# Patient Record
Sex: Male | Born: 1975 | Race: White | Hispanic: No | Marital: Married | Smoking: Current every day smoker
Health system: Southern US, Community
[De-identification: ages and names within clinical notes are randomized; demographics above are authoritative.]

## PROBLEM LIST (undated history)

## (undated) DIAGNOSIS — F329 Major depressive disorder, single episode, unspecified: Secondary | ICD-10-CM

## (undated) DIAGNOSIS — I10 Essential (primary) hypertension: Secondary | ICD-10-CM

## (undated) DIAGNOSIS — F32A Depression, unspecified: Secondary | ICD-10-CM

## (undated) HISTORY — PX: HERNIA REPAIR: SHX51

---

## 2009-08-28 ENCOUNTER — Ambulatory Visit: Payer: Self-pay | Admitting: Internal Medicine

## 2012-04-10 ENCOUNTER — Ambulatory Visit: Payer: Self-pay | Admitting: Emergency Medicine

## 2012-04-10 LAB — RAPID STREP-A WITH REFLX: Micro Text Report: NEGATIVE

## 2012-04-10 LAB — RAPID INFLUENZA A&B ANTIGENS

## 2012-04-12 LAB — BETA STREP CULTURE(ARMC)

## 2013-03-18 ENCOUNTER — Ambulatory Visit: Payer: Self-pay | Admitting: Otolaryngology

## 2013-08-04 ENCOUNTER — Emergency Department: Payer: Self-pay | Admitting: Emergency Medicine

## 2013-11-28 ENCOUNTER — Ambulatory Visit: Payer: Self-pay | Admitting: Physician Assistant

## 2013-11-28 LAB — CBC WITH DIFFERENTIAL/PLATELET
BASOS ABS: 0 10*3/uL (ref 0.0–0.1)
Basophil %: 0.6 %
Eosinophil #: 0.1 10*3/uL (ref 0.0–0.7)
Eosinophil %: 1.8 %
HCT: 43.9 % (ref 40.0–52.0)
HGB: 14.7 g/dL (ref 13.0–18.0)
Lymphocyte #: 1.3 10*3/uL (ref 1.0–3.6)
Lymphocyte %: 16.7 %
MCH: 30.3 pg (ref 26.0–34.0)
MCHC: 33.4 g/dL (ref 32.0–36.0)
MCV: 91 fL (ref 80–100)
Monocyte #: 0.7 x10 3/mm (ref 0.2–1.0)
Monocyte %: 9 %
Neutrophil #: 5.4 10*3/uL (ref 1.4–6.5)
Neutrophil %: 71.9 %
PLATELETS: 179 10*3/uL (ref 150–440)
RBC: 4.84 10*6/uL (ref 4.40–5.90)
RDW: 13.9 % (ref 11.5–14.5)
WBC: 7.5 10*3/uL (ref 3.8–10.6)

## 2013-11-28 LAB — URINALYSIS, COMPLETE
BACTERIA: NEGATIVE
Blood: NEGATIVE
GLUCOSE, UR: NEGATIVE
Ketone: NEGATIVE
Leukocyte Esterase: NEGATIVE
NITRITE: NEGATIVE
PH: 6.5 (ref 5.0–8.0)
SQUAMOUS EPITHELIAL: NONE SEEN
Specific Gravity: 1.02 (ref 1.000–1.030)

## 2013-11-28 LAB — COMPREHENSIVE METABOLIC PANEL
ALK PHOS: 115 U/L
Albumin: 4 g/dL (ref 3.4–5.0)
Anion Gap: 10 (ref 7–16)
BUN: 13 mg/dL (ref 7–18)
Bilirubin,Total: 1.2 mg/dL — ABNORMAL HIGH (ref 0.2–1.0)
CO2: 29 mmol/L (ref 21–32)
Calcium, Total: 9.4 mg/dL (ref 8.5–10.1)
Chloride: 98 mmol/L (ref 98–107)
Creatinine: 1.06 mg/dL (ref 0.60–1.30)
EGFR (Non-African Amer.): >60
GLUCOSE: 108 mg/dL — AB (ref 65–99)
Osmolality: 274 (ref 275–301)
POTASSIUM: 4.1 mmol/L (ref 3.5–5.1)
SGOT(AST): 42 U/L — ABNORMAL HIGH (ref 15–37)
SGPT (ALT): 61 U/L
SODIUM: 137 mmol/L (ref 136–145)
TOTAL PROTEIN: 8 g/dL (ref 6.4–8.2)

## 2013-11-28 LAB — URIC ACID: Uric Acid: 8.5 mg/dL — ABNORMAL HIGH (ref 3.5–7.2)

## 2015-04-30 IMAGING — CR RIGHT FOOT COMPLETE - 3+ VIEW
3 series · 3 of 3 positions shown · non-contrast
Comparison: None.

CLINICAL DATA: Injured foot last week with mid foot pain and slight
swelling

EXAM:
RIGHT FOOT COMPLETE - 3+ VIEW

[foot ap]
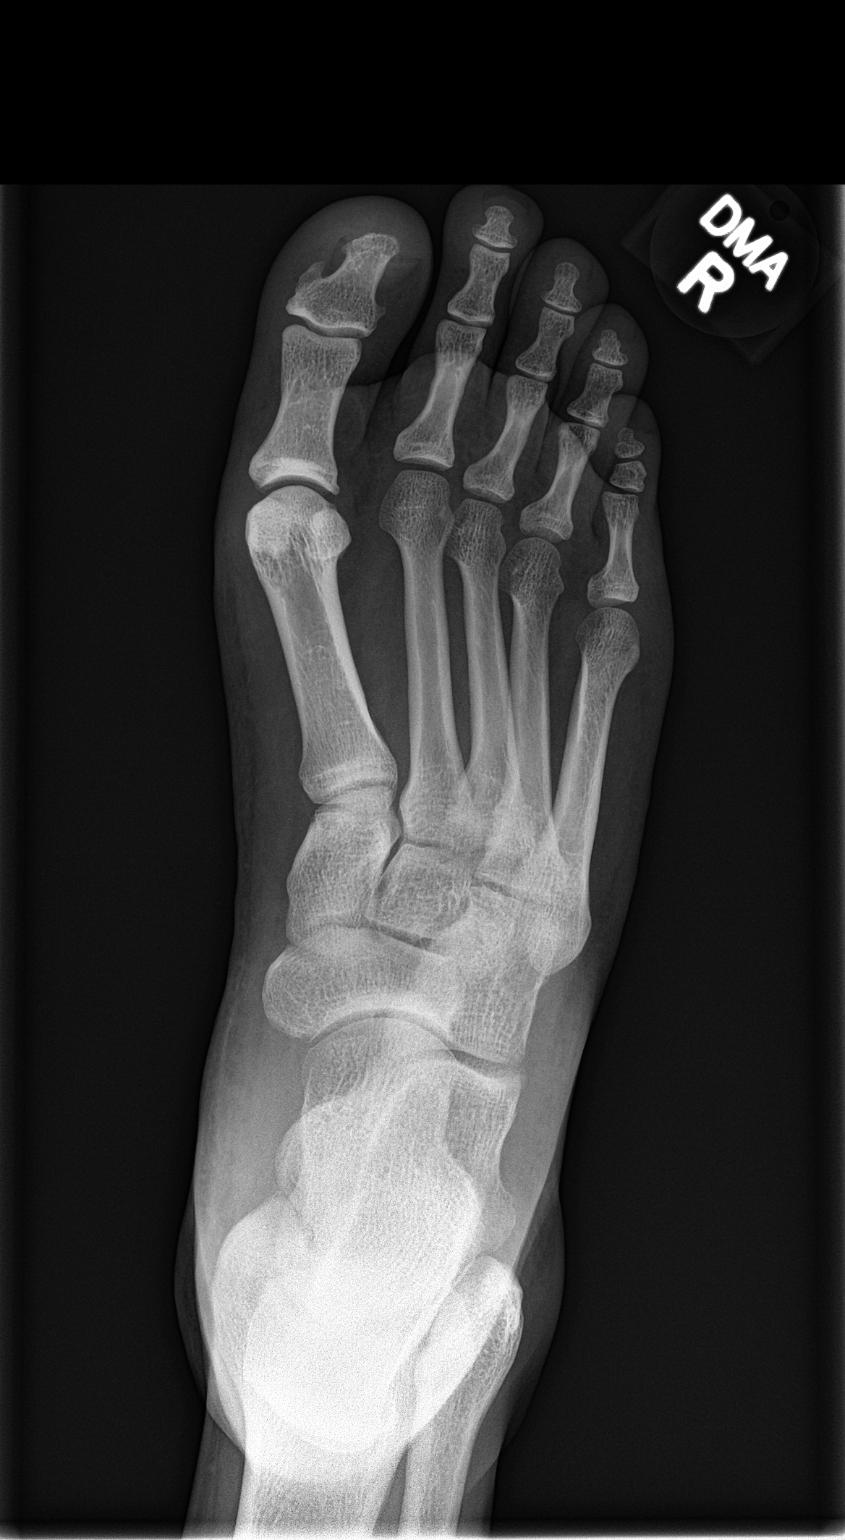

[foot obl]
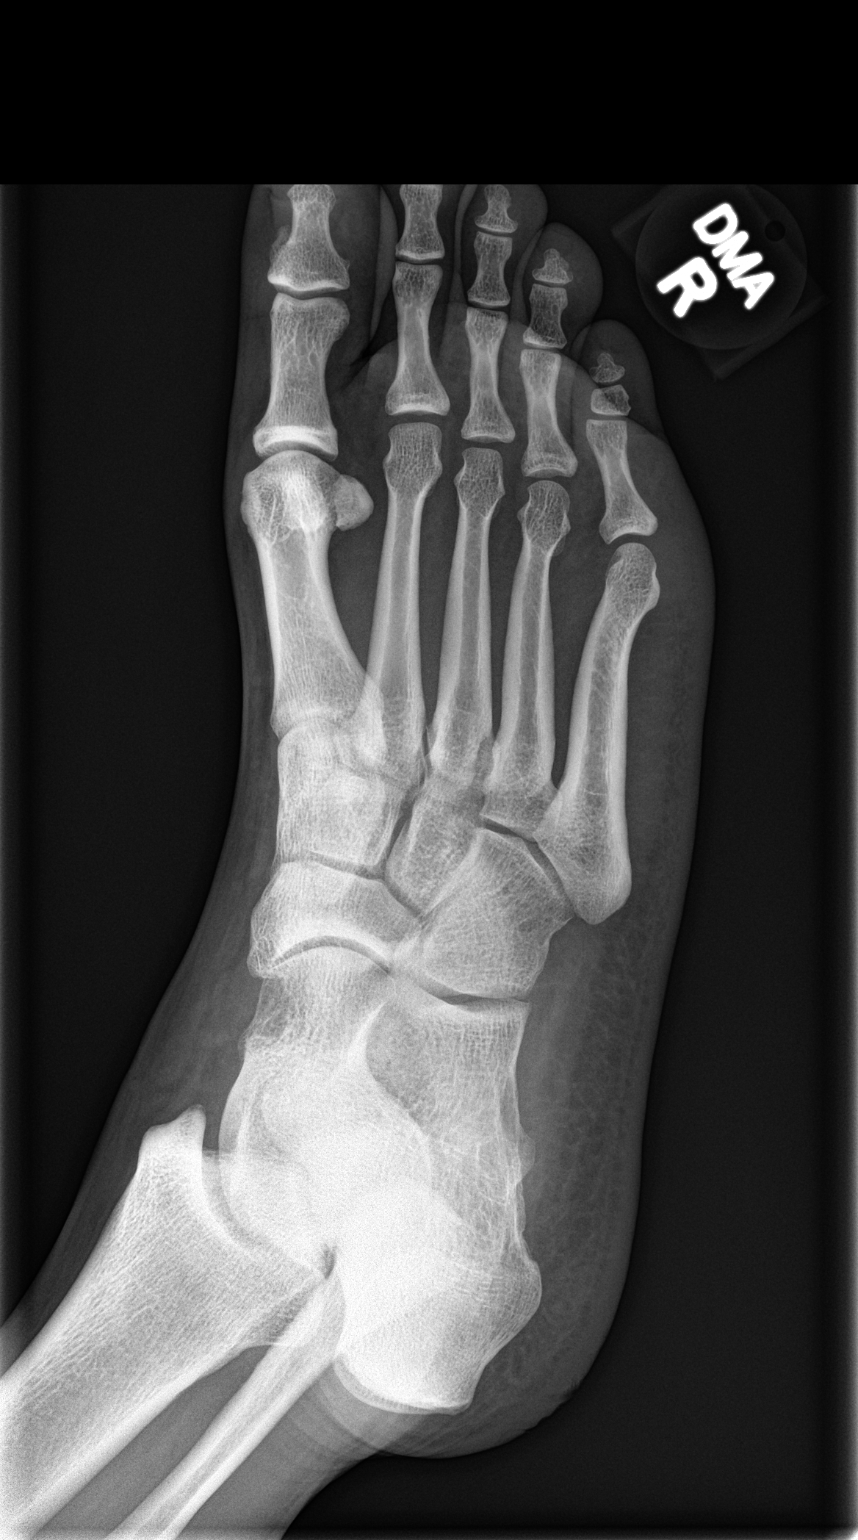

[foot lat]
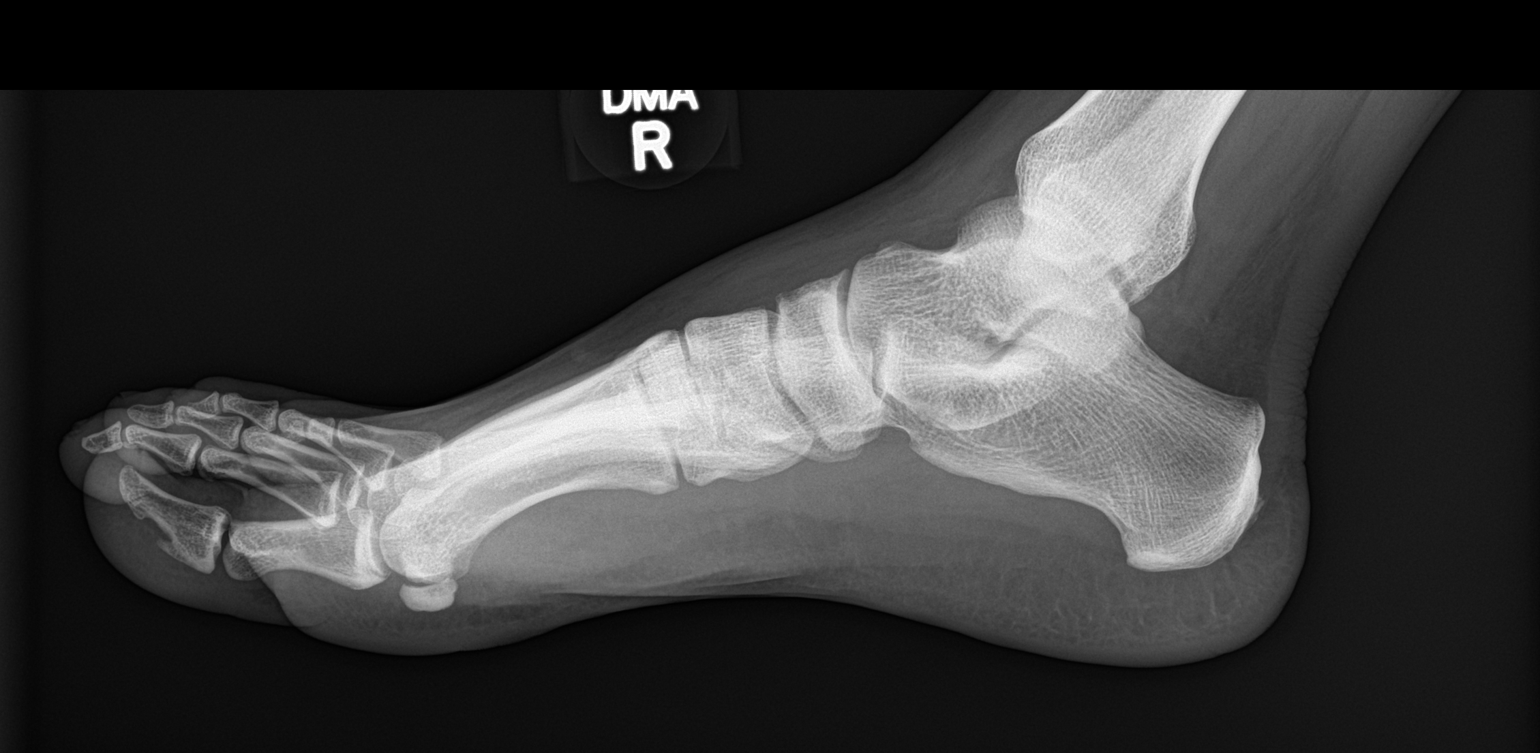

[3 of 3 positions shown; findings below may reference images not displayed]

FINDINGS: Tarsal-metatarsal alignment is normal. No fracture is seen. Joint
spaces appear normal.
IMPRESSION: Negative.

## 2018-04-06 ENCOUNTER — Other Ambulatory Visit: Payer: Self-pay

## 2018-04-06 ENCOUNTER — Ambulatory Visit
Admission: EM | Admit: 2018-04-06 | Discharge: 2018-04-06 | Disposition: A | Discharge status: Home / Self Care | Payer: BLUE CROSS/BLUE SHIELD | Attending: Emergency Medicine | Admitting: Emergency Medicine

## 2018-04-06 DIAGNOSIS — S61022A Laceration with foreign body of left thumb without damage to nail, initial encounter: Secondary | ICD-10-CM | POA: Insufficient documentation

## 2018-04-06 DIAGNOSIS — Z23 Encounter for immunization: Secondary | ICD-10-CM | POA: Diagnosis not present

## 2018-04-06 DIAGNOSIS — W269XXA Contact with unspecified sharp object(s), initial encounter: Secondary | ICD-10-CM | POA: Diagnosis not present

## 2018-04-06 HISTORY — DX: Essential (primary) hypertension: I10

## 2018-04-06 HISTORY — DX: Depression, unspecified: F32.A

## 2018-04-06 HISTORY — DX: Major depressive disorder, single episode, unspecified: F32.9

## 2018-04-06 MED ORDER — TETANUS-DIPHTH-ACELL PERTUSSIS 5-2.5-18.5 LF-MCG/0.5 IM SUSP
0.5000 mL | Freq: Once | INTRAMUSCULAR | Status: AC
  Administered 2018-04-06: 0.5 mL via INTRAMUSCULAR

## 2018-04-06 MED ORDER — CEPHALEXIN 500 MG PO CAPS
500.0000 mg | ORAL_CAPSULE | Freq: Three times a day (TID) | ORAL | 0 refills | Status: AC
Start: 2018-04-06 — End: 2018-04-11

## 2018-04-06 MED ORDER — MUPIROCIN 2 % EX OINT: TOPICAL_OINTMENT | CUTANEOUS | 0 refills | Status: DC

## 2018-04-06 NOTE — ED Triage Notes (Signed)
Patient states that he was working on his daughters four wheeler and cut thumb on piece of metal.

## 2018-04-06 NOTE — Discharge Instructions (Addendum)
Take medication as prescribed. Keep clean and dry. Allow open to air time. Use finger splint for 3 days.   Return to urgent care in 7-10 days for suture removal.   Follow up with your primary care physician this week as needed. Return to Urgent care for new or worsening concerns.

## 2018-04-06 NOTE — ED Provider Notes (Signed)
[facility] ____________________________________________  Time seen: Approximately 8:40 PM  I have reviewed the triage vital signs and the nursing notes.   HISTORY  Chief Complaint Laceration (left thumb)  HPI Miguel Mccarthy is a 43 y.o. male presenting for evaluation of left thumb laceration that occurred just prior to arrival.  Patient states that he was working on his daughter's 4 wheeler and a piece of metal accidentally cut his hand.  States that this was a quick glancing cut.  Denies any crushing injury or other injury.  Last tetanus immunization 6 years ago.  States the area is very mildly painful.  Denies any decreased range of motion, paresthesias or decreased strength.  Reports otherwise doing well denies other complaints. Right hand dominant.   Past Medical History:  Diagnosis Date  . Depression   . Hypertension     There are no active problems to display for this patient.   Past Surgical History:  Procedure Laterality Date  . HERNIA REPAIR       No current facility-administered medications for this encounter.   Current Outpatient Medications:  .  amLODipine (NORVASC) 5 MG tablet, Take by mouth., Disp: , Rfl:  .  sertraline (ZOLOFT) 100 MG tablet, Take by mouth., Disp: , Rfl:  .  cephALEXin (KEFLEX) 500 MG capsule, Take 1 capsule (500 mg total) by mouth 3 (three) times daily for 5 days., Disp: 15 capsule, Rfl: 0 .  mupirocin ointment (BACTROBAN) 2 %, Apply two times a day for 7 days., Disp: 22 g, Rfl: 0  Allergies Patient has no known allergies.  Family History  Problem Relation Age of Onset  . Lung cancer Mother   . Hypertension Mother   . Hyperlipidemia Mother   . Hypertension Father   . Hyperlipidemia Father     Social History Social History   Tobacco Use  . Smoking status: Current Every Day Smoker    Packs/day: 1.00  . Smokeless tobacco: Never Used  Substance Use Topics  . Alcohol use: Not Currently  . Drug use: Not Currently     Review of Systems Constitutional: No fever Cardiovascular: Denies chest pain. Respiratory: Denies shortness of breath. Musculoskeletal: Negative for back pain. Skin: as above.   ____________________________________________   PHYSICAL EXAM:  VITAL SIGNS: ED Triage Vitals  Enc Vitals Group     BP 04/06/18 1814 123/78     Pulse Rate 04/06/18 1814 73     Resp 04/06/18 1814 20     Temp 04/06/18 1814 98.1 F (36.7 C)     Temp Source 04/06/18 1814 Oral     SpO2 04/06/18 1814 97 %     Weight 04/06/18 1813 170 lb (77.1 kg)     Height 04/06/18 1813 5\' 8"  (1.727 m)     Head Circumference --      Peak Flow --      Pain Score 04/06/18 1813 5     Pain Loc --      Pain Edu? --      Excl. in GC? --     Constitutional: Alert and oriented. Well appearing and in no acute distress. ENT      Head: Normocephalic and atraumatic. Cardiovascular: Normal rate, regular rhythm. Grossly normal heart sounds.  Good peripheral circulation. Respiratory: Normal respiratory effort without tachypnea nor retractions. Breath sounds are clear and equal bilaterally. No wheezes, rales, rhonchi. Musculoskeletal:  Steady gait.  Neurologic:  Normal speech and language. Speech is normal. No gait instability.  Skin:  Skin  is warm, dry.  Except: Left hand, medial lateral aspect of left thumb linear superficial 4.5 cmlaceration that extends from lateral base of nail down to proximal phalanx base, mild active bleeding, small metallic foreign body visualized superficially, no tendon or bone visualized, normal distal sensation and capillary refill, left thumb with full range of motion present, good resisted flexion and extension, no motor or tendon deficit noted, no point bony tenderness, laceration mild tenderness to direct palpation. Psychiatric: Mood and affect are normal. Speech and behavior are normal. Patient exhibits appropriate insight and judgment   ___________________________________________   LABS (all  labs ordered are listed, but only abnormal results are displayed)  Labs Reviewed - No data to display  PROCEDURES Procedures   Procedure(s) performed:  Procedure explained and verbal consent obtained. Consent: Verbal consent obtained. Written consent not obtained. Risks and benefits: risks, benefits and alternatives were discussed Patient identity confirmed: verbally with patient and hospital-assigned identification number  Consent given by: patient   Laceration Repair Location: left thumb Length: 4.5 cm. Foreign bodies: superficial bluish metallic 2 mm foreign body removed, wound base inspected, no retained foreign bodies found. Tendon involvement: none Nerve involvement: none Preparation: Patient was prepped and draped in the usual sterile fashion. Anesthesia with 1% Lidocaine 6 mls With Betadine Irrigation solution: saline Irrigation method: jet lavage Amount of cleaning: copious Repaired with 5-0 nylon Number of sutures: 9 Technique: simple interrupted  Approximation: loose Patient tolerate well. Wound well approximated post repair.  Antibiotic ointment and dressing applied.  Wound care instructions provided.  Observe for any signs of infection or other problems.      INITIAL IMPRESSION / ASSESSMENT AND PLAN / ED COURSE  Pertinent labs & imaging results that were available during my care of the patient were reviewed by me and considered in my medical decision making (see chart for details).  Well-appearing patient.  No acute distress. Left thumb laceration, copiously cleaned, irrigated and repaired as above.  Small metallic foreign body removed.  Doubt any retained foreign body, as base of wound inspected.  Tetanus immunization updated.  Recommend suture removal in 7 to 10 days.  Will empirically place on Keflex and topical Bactroban.  Thumb splint given for use for 3 days to protect suture integrity.  Discussed keeping clean, monitoring, open air time and supportive  care.Discussed indication, risks and benefits of medications with patient.   Discussed follow up and return parameters including no resolution or any worsening concerns. Patient verbalized understanding and agreed to plan.   ____________________________________________   FINAL CLINICAL IMPRESSION(S) / ED DIAGNOSES  Final diagnoses:  Laceration of left thumb with foreign body without damage to nail, initial encounter     ED Discharge Orders         Ordered    cephALEXin (KEFLEX) 500 MG capsule  3 times daily     04/06/18 2018    mupirocin ointment (BACTROBAN) 2 %     04/06/18 2018           Note: This dictation was prepared with Dragon dictation along with smaller phrase technology. Any transcriptional errors that result from this process are unintentional.         Renford DillsMiller, Abas Leicht, NP 04/06/18 2048

## 2019-02-06 ENCOUNTER — Encounter: Payer: Self-pay | Admitting: Emergency Medicine

## 2019-02-06 ENCOUNTER — Ambulatory Visit
Admission: EM | Admit: 2019-02-06 | Discharge: 2019-02-06 | Disposition: A | Discharge status: Home / Self Care | Payer: BC Managed Care – PPO | Attending: Urgent Care | Admitting: Urgent Care

## 2019-02-06 ENCOUNTER — Other Ambulatory Visit: Payer: Self-pay

## 2019-02-06 DIAGNOSIS — J039 Acute tonsillitis, unspecified: Secondary | ICD-10-CM

## 2019-02-06 DIAGNOSIS — Z7189 Other specified counseling: Secondary | ICD-10-CM

## 2019-02-06 DIAGNOSIS — J029 Acute pharyngitis, unspecified: Secondary | ICD-10-CM

## 2019-02-06 DIAGNOSIS — Z20828 Contact with and (suspected) exposure to other viral communicable diseases: Secondary | ICD-10-CM

## 2019-02-06 DIAGNOSIS — Z20822 Contact with and (suspected) exposure to covid-19: Secondary | ICD-10-CM

## 2019-02-06 LAB — RAPID STREP SCREEN (MED CTR MEBANE ONLY): Streptococcus, Group A Screen (Direct): NEGATIVE

## 2019-02-06 MED ORDER — AMOXICILLIN 875 MG PO TABS: 875.0000 mg | ORAL_TABLET | Freq: Two times a day (BID) | ORAL | 0 refills | Status: AC

## 2019-02-06 NOTE — ED Provider Notes (Signed)
[place], [place]   Name: Miguel PascalDanny C Doyel DOB: 08/13/1975 MRN: [id number] CSN: [id number] PCP: Patient, No Pcp Per  Arrival date and time:  02/06/19 1541  Chief Complaint:  Sore Throat   NOTE: Prior to seeing the patient today, I have reviewed the triage nursing documentation and vital signs. Clinical staff has updated patient's PMH/PSHx, current medication list, and drug allergies/intolerances to ensure comprehensive history available to assist in medical decision making.   History:   HPI: Miguel Mccarthy is a 43 y.o. male who presents today with complaints of sore throat that began approximately 3 days ago. Patient has not experienced any significant fevers; temperatures running in the 99 range. Patient reports that his pain is only on the LEFT side of his throat. He has associated tender LAD on the LEFT as well. Patient denies any dysphagia or shortness of breath. He denies that he has experienced any nausea, vomiting, diarrhea, or abdominal pain. He is eating and drinking well. Patient denies any perceived alterations to his sense of taste or smell. Patient denies being in close contact with anyone known to be ill. He has never been tested for SARS-CoV-2 (novel coronavirus) per his report. Despite his symptoms, patient has not taken any over the counter interventions to help improve/relieve his reported symptoms at home.    Past Medical History:  Diagnosis Date  . Depression   . Hypertension     Past Surgical History:  Procedure Laterality Date  . HERNIA REPAIR      Family History  Problem Relation Age of Onset  . Lung cancer Mother   . Hypertension Mother   . Hyperlipidemia Mother   . Hypertension Father   . Hyperlipidemia Father     Social History   Tobacco Use  . Smoking status: Current Every Day Smoker    Packs/day: 1.00    Types: Cigarettes  . Smokeless tobacco: Never Used  Substance Use Topics  . Alcohol use: Not Currently  . Drug use: Not Currently    There are  no active problems to display for this patient.   Home Medications:    Current Meds  Medication Sig  . amLODipine (NORVASC) 5 MG tablet Take by mouth.  . sertraline (ZOLOFT) 100 MG tablet Take by mouth.    Allergies:   Patient has no known allergies.  Review of Systems (ROS): Review of Systems  Constitutional: Positive for fever (99 range). Negative for fatigue.  HENT: Positive for sore throat. Negative for congestion, drooling, ear pain, postnasal drip, rhinorrhea, sinus pressure, sinus pain, sneezing and trouble swallowing.   Eyes: Negative for pain, discharge and redness.  Respiratory: Negative for cough, chest tightness and shortness of breath.   Cardiovascular: Negative for chest pain and palpitations.  Gastrointestinal: Negative for abdominal pain, diarrhea, nausea and vomiting.  Musculoskeletal: Negative for arthralgias, back pain, myalgias and neck pain.  Skin: Negative for color change, pallor and rash.  Neurological: Negative for dizziness, syncope, weakness and headaches.  Hematological: Positive for adenopathy.     Vital Signs: Today's Vitals   02/06/19 1554 02/06/19 1555 02/06/19 1557  BP:   121/80  Pulse:   91  Resp:   18  Temp:   98.8 F (37.1 C)  TempSrc:   Oral  SpO2:   98%  Weight:  135 lb (61.2 kg)   Height:  5\' 8"  (1.727 m)   PainSc: 5       Physical Exam: Physical Exam  Constitutional: He is oriented to person, place,  and time and well-developed, well-nourished, and in no distress. No distress.  HENT:  Head: Normocephalic and atraumatic.  Right Ear: Tympanic membrane normal.  Left Ear: Tympanic membrane normal.  Nose: Nose normal.  Mouth/Throat: Uvula is midline and mucous membranes are normal. Posterior oropharyngeal erythema present. No posterior oropharyngeal edema.  LEFT tonsil grade III with (+) exudate (white) noted.   Eyes: Pupils are equal, round, and reactive to light. Conjunctivae and EOM are normal.  Neck: Normal range of motion.  Neck supple.  Cardiovascular: Normal rate, regular rhythm and intact distal pulses.  Pulmonary/Chest: Effort normal and breath sounds normal. No respiratory distress.  Abdominal: Soft. Normal appearance and bowel sounds are normal. He exhibits no distension. There is no abdominal tenderness.  Musculoskeletal: Normal range of motion.  Lymphadenopathy:       Head (left side): Submandibular and tonsillar adenopathy present.  Neurological: He is alert and oriented to person, place, and time. Gait normal.  Skin: Skin is warm and dry. No rash noted. He is not diaphoretic.  Psychiatric: Mood, memory, affect and judgment normal.  Nursing note and vitals reviewed.   Urgent Care Treatments / Results:   LABS: PLEASE NOTE: all labs that were ordered this encounter are listed, however only abnormal results are displayed. Labs Reviewed  RAPID STREP SCREEN ([facility] ONLY)  NOVEL CORONAVIRUS, NAA (HOSP ORDER, SEND-OUT TO REF LAB; TAT 18-24 HRS)  CULTURE, GROUP A STREP [facility])    EKG: -None  RADIOLOGY: No results found.  PROCEDURES: Procedures  MEDICATIONS RECEIVED THIS VISIT: Medications - No data to display  PERTINENT CLINICAL COURSE NOTES/UPDATES:   Initial Impression / Assessment and Plan / Urgent Care Course:  Pertinent labs & imaging results that were available during my care of the patient were personally reviewed by me and considered in my medical decision making (see lab/imaging section of note for values and interpretations).  Miguel Mccarthy is a 43 y.o. male who presents to [facility] today with complaints of Sore Throat   Patient overall well appearing and in no acute distress today in clinic. Presenting symptoms (see HPI) and exam as documented above. He presents with symptoms associated with SARS-CoV-2 (novel coronavirus). Discussed typical symptom constellation. Reviewed potential for infection and need for testing. Patient amenable to being tested. SARS-CoV-2  swab collected by certified clinical staff. Discussed variable turn around times associated with testing, as swabs are being processed at [facility], and have been taking between 2-5 days to come back. He was advised to self quarantine, per [facility] DHHS guidelines, until negative results received.   Rapid streptococcal throat swab (-); reflex culture sent. Exam with unilateral tonsillar enlargement with associated exudate and tender palpable LAD. Patient denies SOB and dysphagia. He is able to handle oral secretions and thin liquids. Will proceed with treatment for exudative tonsillitis using a 10 day course of amoxicillin. Discussed supportive care measures at home during acute phase of illness. Patient to rest as much as possible. He was encouraged to ensure adequate hydration (water and ORS) to prevent dehydration and electrolyte derangements. Patient may use APAP and/or IBU on an as needed basis for pain/fever. Recommended warm salt water gargles and throat lozenges as complimentary approaches to improving the associated pain and inflammation.   Current clinical condition warrants patient being out of work in order to quarantine while waiting for testing results. He was provided with the appropriate documentation to provide to his place of employment that will allow for him to RTW on 02/09/2019  with no restrictions. RTW is contingent on his SARS-CoV-2 test results being reviewed as negative.   Discussed follow up with primary care physician in 1 week for re-evaluation. I have reviewed the follow up and strict return precautions for any new or worsening symptoms. Patient is aware of symptoms that would be deemed urgent/emergent, and would thus require further evaluation either here or in the emergency department. At the time of discharge, he verbalized understanding and consent with the discharge plan as it was reviewed with him. All questions were fielded by provider and/or clinic staff prior to patient discharge.     Final Clinical Impressions / Urgent Care Diagnoses:   Final diagnoses:  Exudative tonsillitis  Encounter for laboratory testing for COVID-19 virus  Advice given about COVID-19 virus infection    New Prescriptions:  [place] Controlled Substance Registry consulted? Not Applicable  Meds ordered this encounter  Medications  . amoxicillin (AMOXIL) 875 MG tablet    Sig: Take 1 tablet (875 mg total) by mouth 2 (two) times daily for 10 days.    Dispense:  20 tablet    Refill:  0    Recommended Follow up Care:  Patient encouraged to follow up with the following provider within the specified time frame, or sooner as dictated by the severity of his symptoms. As always, he was instructed that for any urgent/emergent care needs, he should seek care either here or in the emergency department for more immediate evaluation.  Follow-up Information    PCP In 1 week.   Why: General reassessment of symptoms if not improving        NOTE: This note was prepared using Scientist, clinical (histocompatibility and immunogenetics) along with smaller Lobbyist. Despite my best ability to proofread, there is the potential that transcriptional errors may still occur from this process, and are completely unintentional.    Verlee Monte, NP 02/07/19 1947

## 2019-02-06 NOTE — ED Triage Notes (Signed)
Pt c/o sore throat and swollen lymph node on the left side of his neck. Started about 3 days ago. Denies fever or other URI symptoms.

## 2019-02-06 NOTE — Discharge Instructions (Signed)
It was very nice seeing you today in clinic. Thank you for entrusting me with your care.   Rest and increase fluid intake. Warm salt water gargles and throat lozenges may help with soothing your throat. Please utilize the medications that we discussed. Your prescriptions has been called in to your pharmacy. May use Tylenol and/or Ibuprofen as needed for pain/fever.   You were tested for SARS-CoV-2 (novel coronavirus) today. Testing is performed by an outside lab ([facility]) and has variable turn around times ranging between 2-5 days. Current recommendations from the the CDC and  DHHS require that you remain out of work in order to quarantine at home until negative test results are have been received. In the event that your test results are positive, you will be contacted with further directives. These measures are being implemented out of an abundance of caution to prevent transmission and spread during the current SARS-CoV-2 pandemic.  Make arrangements to follow up with your regular doctor in 1 week for re-evaluation if not improving. If your symptoms/condition worsens, please seek follow up care either here or in the ER. Please remember, our [place] providers are "right here with you" when you need [place].   Again, it was my pleasure to take care of you today. Thank you for choosing our clinic. I hope that you start to feel better quickly.   Honor Loh, MSN, APRN, FNP-C, CEN Advanced Practice Provider [facility]

## 2019-02-07 LAB — CULTURE, GROUP A STREP (THRC)

## 2019-02-07 LAB — NOVEL CORONAVIRUS, NAA (HOSP ORDER, SEND-OUT TO REF LAB; TAT 18-24 HRS): SARS-CoV-2, NAA: NOT DETECTED
